# Patient Record
Sex: Male | Born: 1995 | Race: Black or African American | Hispanic: No | Marital: Single | Smoking: Never smoker
Health system: Southern US, Community
[De-identification: ages and names within clinical notes are randomized; demographics above are authoritative.]

## PROBLEM LIST (undated history)

## (undated) DIAGNOSIS — Z789 Other specified health status: Secondary | ICD-10-CM

## (undated) DIAGNOSIS — M67431 Ganglion, right wrist: Secondary | ICD-10-CM

## (undated) HISTORY — PX: NO PAST SURGERIES: SHX2092

---
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
## 2015-08-12 ENCOUNTER — Emergency Department (INDEPENDENT_AMBULATORY_CARE_PROVIDER_SITE_OTHER): Payer: Medicaid Other

## 2015-08-12 ENCOUNTER — Encounter (HOSPITAL_COMMUNITY): Payer: Self-pay | Admitting: Emergency Medicine

## 2015-08-12 ENCOUNTER — Emergency Department (INDEPENDENT_AMBULATORY_CARE_PROVIDER_SITE_OTHER)
Admission: EM | Admit: 2015-08-12 | Discharge: 2015-08-12 | Disposition: A | Discharge status: Home / Self Care | Payer: Medicaid Other | Source: Home / Self Care | Attending: Family Medicine | Admitting: Family Medicine

## 2015-08-12 DIAGNOSIS — M67431 Ganglion, right wrist: Secondary | ICD-10-CM

## 2015-08-12 NOTE — Discharge Instructions (Signed)
Wear splint for comfort and see orthopedist if further problems. Ice and advil for soreness.

## 2015-08-12 NOTE — ED Notes (Signed)
Right wrist with a cyst posterior wrist.  Patient reports wrist injury 2 years ago.  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  Mother and patient think this has aggravated old injury

## 2015-08-12 NOTE — ED Provider Notes (Signed)
CSN: [id number]     Arrival date & time 08/12/15  1749 History   None    Chief Complaint  Patient presents with  . Wrist Injury   (Consider location/radiation/quality/duration/timing/severity/associated sxs/prior Treatment) Patient is a 19 y.o. male presenting with wrist injury. The history is provided by the patient and a parent.  Wrist Injury Location:  Wrist Injury: yes   Mechanism of injury: fall   Mechanism of injury comment:  Reports hyperext injury to right wrist in 11th gr and recently noted sts cystic lump to dorsum of wrist.   History reviewed. No pertinent past medical history. History reviewed. No pertinent past surgical history. No family history on file. Social History  Substance Use Topics  . Smoking status: Never Smoker   . Smokeless tobacco: None  . Alcohol Use: No    Review of Systems  Musculoskeletal: Positive for joint swelling.  Skin: Negative.   All other systems reviewed and are negative.   Allergies  Review of patient's allergies indicates no known allergies.  Home Medications   Prior to Admission medications   Not on File   Meds Ordered and Administered this Visit  Medications - No data to display  BP 135/79 mmHg  Pulse 58  Temp(Src) 98.8 F (37.1 C) (Oral)  Resp 16  SpO2 100% No data found.   Physical Exam  Constitutional: He is oriented to person, place, and time. He appears well-developed and well-nourished.  Musculoskeletal: He exhibits tenderness.       Right wrist: He exhibits tenderness and swelling. He exhibits no deformity.       Arms: Neurological: He is alert and oriented to person, place, and time.  Skin: Skin is warm and dry.  Nursing note and vitals reviewed.   ED Course  Procedures (including critical care time)  Labs Review Labs Reviewed - No data to display  Imaging Review Dg Wrist Complete Right  08/12/2015  CLINICAL DATA:  Pain. Repetitive motion duties at work. No recent trauma. EXAM: RIGHT WRIST -  COMPLETE 3+ VIEW COMPARISON:  June 01, 2010 FINDINGS: Frontal, oblique, lateral, and ulnar deviation scaphoid images were obtained. No fracture or dislocation. Joint spaces appear intact. No erosive change or intra-articular calcification. IMPRESSION: No fracture or dislocation.  No appreciable arthropathy. Electronically Signed   By: Bretta BangWilliam  Woodruff III M.D.   On: 08/12/2015 19:19   X-rays reviewed and report per radiologist.   Visual Acuity Review  Right Eye Distance:   Left Eye Distance:   Bilateral Distance:    Right Eye Near:   Left Eye Near:    Bilateral Near:         MDM   1. Ganglion cyst of wrist, right        Linna HoffJames D Abrian Hanover, MD 08/12/15 [phone]

## 2019-12-05 ENCOUNTER — Ambulatory Visit: Payer: Medicaid Other | Attending: Internal Medicine

## 2019-12-05 DIAGNOSIS — Z23 Encounter for immunization: Secondary | ICD-10-CM

## 2019-12-05 NOTE — Progress Notes (Signed)
   Covid-19 Vaccination Clinic  Name:  URIYAH RASKA    MRN: [id number] DOB: [birth year]  12/05/2019  Mr. Danford was observed post Covid-19 immunization for 15 minutes without incident. He was provided with Vaccine Information Sheet and instruction to access the V-Safe system.   Mr. Fuqua was instructed to call 911 with any severe reactions post vaccine: Marland Kitchen Difficulty breathing  . Swelling of face and throat  . A fast heartbeat  . A bad rash all over body  . Dizziness and weakness   Immunizations Administered    Name Date Dose VIS Date Route   Pfizer COVID-19 Vaccine 12/05/2019  2:07 PM 0.3 mL 08/14/2019 Intramuscular   Manufacturer: [employer], [employer]   Lot: [id number]   NDC: [address]-4

## 2019-12-29 ENCOUNTER — Ambulatory Visit: Payer: Medicaid Other

## 2019-12-30 ENCOUNTER — Ambulatory Visit: Payer: Medicaid Other | Attending: Internal Medicine

## 2019-12-30 DIAGNOSIS — Z23 Encounter for immunization: Secondary | ICD-10-CM

## 2019-12-30 NOTE — Progress Notes (Signed)
   Covid-19 Vaccination Clinic  Name:  Jacob Bean    MRN: [id number] DOB: 1995/09/27  12/30/2019  Mr. Jacob Bean was observed post Covid-19 immunization for 15 minutes without incident. He was provided with Vaccine Information Sheet and instruction to access the V-Safe system.   Mr. Jacob Bean was instructed to call 911 with any severe reactions post vaccine: Marland Kitchen Difficulty breathing  . Swelling of face and throat  . A fast heartbeat  . A bad rash all over body  . Dizziness and weakness   Immunizations Administered    Name Date Dose VIS Date Route   Pfizer COVID-19 Vaccine 12/30/2019  8:28 AM 0.3 mL 10/28/2018 Intramuscular   Manufacturer: [employer], [employer]   Lot: [id number]   NDC: [address]-2

## 2021-05-15 ENCOUNTER — Ambulatory Visit (HOSPITAL_COMMUNITY)
Admission: EM | Admit: 2021-05-15 | Discharge: 2021-05-15 | Disposition: A | Discharge status: Home / Self Care | Payer: No Typology Code available for payment source | Attending: Sports Medicine | Admitting: Sports Medicine

## 2021-05-15 ENCOUNTER — Other Ambulatory Visit: Payer: Self-pay

## 2021-05-15 ENCOUNTER — Encounter (HOSPITAL_COMMUNITY): Payer: Self-pay

## 2021-05-15 DIAGNOSIS — M67431 Ganglion, right wrist: Secondary | ICD-10-CM | POA: Diagnosis not present

## 2021-05-15 MED ORDER — LIDOCAINE HCL (PF) 1 % IJ SOLN
INTRAMUSCULAR | Status: AC
  Filled 2021-05-15: qty 2

## 2021-05-15 NOTE — ED Triage Notes (Signed)
Pt presents w/ c/o a cyst on his right wrist as well as skin dryness on rt hand from gloves used while working

## 2021-05-15 NOTE — Discharge Instructions (Signed)
Can keep compression over the wrist for the next 2-3 days  May take Ibuprofen or Aleve or Tylenol if painful  If reoccurs, consider going to see orthopedics or f/u with PCP

## 2021-05-15 NOTE — ED Provider Notes (Signed)
[facility]    CSN: [id number] Arrival date & time: 05/15/21  1556      History   Chief Complaint Chief Complaint  Patient presents with   Cyst    Rt wrist with dry skin on top of hand    HPI Jacob Bean is a 25 y.o. male here for cyst on right dorsum of wrist.  HPI  Jacob Bean is a 25 year old male who presents for cyst on the right dorsum of his right wrist.  He also reports some dry skin over bilateral hands, he believes this is from wearing vinyl gloves at work.  In terms of the cyst, he states it has been present since he was about 77-47 years of age.  It waxes and wanes in size but believes it has always been present since this time, at times it is slightly tender when he bumps it or when he is flexing or extending the wrist, but most times it does not bother him.  He denies any redness, erythema, or any drainage from the wrist.  He denies any prior injury to the wrist.  Any weakness or limited range of motion because of the cyst.  In the past, he has been told it is a ganglion cyst by prior providers.  History reviewed. No pertinent past medical history.  Patient Active Problem List   Diagnosis Date Noted   CONSTIPATION 10/31/2006    History reviewed. No pertinent surgical history.     Home Medications    Prior to Admission medications   Not on File    Family History History reviewed. No pertinent family history.  Social History Social History   Tobacco Use   Smoking status: Never   Smokeless tobacco: Never  Substance Use Topics   Alcohol use: No   Drug use: No     Allergies   Patient has no known allergies.   Review of Systems Review of Systems - reports dry skin on hands - reports cyst on wrist - no fever/chills, rashes, or redness   Physical Exam Triage Vital Signs ED Triage Vitals  Enc Vitals Group     BP 05/15/21 1700 124/78     Pulse Rate 05/15/21 1700 71     Resp 05/15/21 1700 14     Temp 05/15/21 1700 98.6 F (37 C)      Temp Source 05/15/21 1700 Oral     SpO2 05/15/21 1700 96 %     Weight 05/15/21 1701 154 lb (69.9 kg)     Height 05/15/21 1701 5\' 6"  (1.676 m)     Head Circumference --      Peak Flow --      Pain Score 05/15/21 1701 0     Pain Loc --      Pain Edu? --      Excl. in GC? --    No data found.  Updated Vital Signs BP 124/78 (BP Location: Left Arm)   Pulse 71   Temp 98.6 F (37 C) (Oral)   Resp 14   Ht 5\' 6"  (1.676 m)   Wt 69.9 kg   SpO2 96%   BMI 24.86 kg/m   Visual Acuity Right Eye Distance:   Left Eye Distance:   Bilateral Distance:    Right Eye Near:   Left Eye Near:    Bilateral Near:     Physical Exam Gen: Well-appearing, in no acute distress; non-toxic CV: Regular Rate. Well-perfused. Warm.  Resp: Breathing unlabored on room air; no  wheezing. Psych: Fluid speech in conversation; appropriate affect; normal thought process Neuro: Sensation intact throughout. No gross coordination deficits.  MSK:  - Right wrist: There is approximately a 1 x 1.5 cm raised cyst on the dorsum of the right wrist.  There is no overlying erythema, ecchymosis swelling or induration.  There is mild TTP over this wrist joint.  There is full range of motion at the wrist, although slight tenderness with maximal wrist flexion.  Station to the wrist and all 5 digits of the right upper extremity intact to light touch on the dorsal and plantar aspect.  No TTP of the DRUJ, ulnar styloid or radius.  Strength 5/5 in all directions of the wrist and with grip strength.  U/S: Bedside ultrasound demonstrates approximately 1 x 1.5 cm hypoechoic, well-circumscribed cyst without septation or calcification within.  No vascular flow intake   UC Treatments / Results  Labs (all labs ordered are listed, but only abnormal results are displayed) Labs Reviewed - No data to display  EKG   Radiology No results found.  Procedures Incision and Drainage  Date/Time: 05/15/2021 5:59 PM Performed by: Madelyn Brunner, DO Authorized by: Madelyn Brunner, DO   Consent:    Consent obtained:  Verbal   Consent given by:  Patient   Risks, benefits, and alternatives were discussed: yes     Risks discussed:  Bleeding, incomplete drainage, pain and infection   Alternatives discussed:  Alternative treatment, observation and no treatment Universal protocol:    Procedure explained and questions answered to patient or proxy's satisfaction: yes     Relevant documents present and verified: yes     Test results available : yes     Imaging studies available: yes     Site/side marked: yes     Immediately prior to procedure, a time out was called: yes     Patient identity confirmed:  Verbally with patient Location:    Type:  Ganglion cyst   Size:  1 x 1.5cm   Location:  Upper extremity   Upper extremity location:  Wrist   Wrist location:  R wrist Pre-procedure details:    Skin preparation:  Povidone-iodine and chlorhexidine with alcohol Sedation:    Sedation type:  None Anesthesia:    Anesthesia method:  Local infiltration   Local anesthetic:  Lidocaine 1% w/o epi Procedure type:    Complexity:  Simple Post-procedure details:    Procedure completion:  Tolerated well, no immediate complications (including critical care time)  - Aspiration yielded approx 0.3 mL of viscous, yellow-clear liquid   Medications Ordered in UC Medications - No data to display  Initial Impression / Assessment and Plan / UC Course  I have reviewed the triage vital signs and the nursing notes.  Pertinent labs & imaging results that were available during my care of the patient were reviewed by me and considered in my medical decision making (see chart for details).     Dorsal ganglion cyst, left wrist - present x 7-8 years.  Discussed conservative treatment for the patient, however patient desired aspiration.  I&D aspiration, as noted above yielded 0.3 mL of a clear, viscous liquid.  Wrist was wrapped with compression Ace wrap.   He is to keep compression over this for the next 2-3 days, may take over-the-counter anti-inflammatories if painful.  Risk/benefits/indications discussed.  Patient safe for discharge home.  He may follow-up with his PCP if this returns, we did discuss the likelihood of this returning even with aspiration. Final  Clinical Impressions(s) / UC Diagnoses   Final diagnoses:  Ganglion cyst of dorsum of right wrist     Discharge Instructions      Can keep compression over the wrist for the next 2-3 days  May take Ibuprofen or Aleve or Tylenol if painful  If reoccurs, consider going to see orthopedics or f/u with PCP      ED Prescriptions   None    PDMP not reviewed this encounter.   Madelyn Brunner, [place] 05/15/21 1823

## 2021-07-04 ENCOUNTER — Other Ambulatory Visit: Payer: Self-pay

## 2021-07-04 ENCOUNTER — Encounter (HOSPITAL_COMMUNITY): Payer: Self-pay

## 2021-07-04 ENCOUNTER — Ambulatory Visit (HOSPITAL_COMMUNITY)
Admission: EM | Admit: 2021-07-04 | Discharge: 2021-07-04 | Disposition: A | Discharge status: Home / Self Care | Payer: No Typology Code available for payment source

## 2021-07-04 DIAGNOSIS — M67431 Ganglion, right wrist: Secondary | ICD-10-CM

## 2021-07-04 NOTE — ED Triage Notes (Signed)
Pt presents with c/o a cyst on the R wrist.

## 2021-07-04 NOTE — ED Provider Notes (Signed)
[facility]    CSN: [id number] Arrival date & time: 07/04/21  1724      History   Chief Complaint Chief Complaint  Patient presents with   Hand Problem    R    HPI ARUSH GATLIFF is a 25 y.o. male presenting for follow-up of R wrist ganglion cyst that has been present for 11 to 12 years.  Medical history noncontributory.  This patient was last at our urgent care 1 month ago when the cyst was drained. It immediately returned. He was advised to follow-up with PCP or Ortho, which he has not done as he does not have either.  This is minimally painful and he is left-handed.  HPI  History reviewed. No pertinent past medical history.  Patient Active Problem List   Diagnosis Date Noted   CONSTIPATION 10/31/2006    History reviewed. No pertinent surgical history.     Home Medications    Prior to Admission medications   Not on File    Family History History reviewed. No pertinent family history.  Social History Social History   Tobacco Use   Smoking status: Never   Smokeless tobacco: Never  Substance Use Topics   Alcohol use: No   Drug use: No     Allergies   Patient has no known allergies.   Review of Systems Review of Systems  Skin:        R wrist cyst    Physical Exam Triage Vital Signs ED Triage Vitals  Enc Vitals Group     BP 07/04/21 1838 118/88     Pulse Rate 07/04/21 1838 63     Resp 07/04/21 1838 16     Temp 07/04/21 1838 98.5 F (36.9 C)     Temp Source 07/04/21 1838 Oral     SpO2 07/04/21 1838 99 %     Weight --      Height --      Head Circumference --      Peak Flow --      Pain Score 07/04/21 1837 0     Pain Loc --      Pain Edu? --      Excl. in GC? --    No data found.  Updated Vital Signs BP 118/88 (BP Location: Right Arm)   Pulse 63   Temp 98.5 F (36.9 C) (Oral)   Resp 16   SpO2 99%   Visual Acuity Right Eye Distance:   Left Eye Distance:   Bilateral Distance:    Right Eye Near:   Left Eye Near:     Bilateral Near:     Physical Exam Vitals reviewed.  Constitutional:      General: He is not in acute distress.    Appearance: Normal appearance. He is not ill-appearing.  HENT:     Head: Normocephalic and atraumatic.  Pulmonary:     Effort: Pulmonary effort is normal.  Musculoskeletal:     Comments: R wrist- 1x1.5 cm raised cyst on dorsum wrist. No other overlying skin changes. Mildly TTP over joint. Full ROM, slight tenderness with maximal flexion.   Neurological:     General: No focal deficit present.     Mental Status: He is alert and oriented to person, place, and time.  Psychiatric:        Mood and Affect: Mood normal.        Behavior: Behavior normal.        Thought Content: Thought content normal.  Judgment: Judgment normal.    UC Treatments / Results  Labs (all labs ordered are listed, but only abnormal results are displayed) Labs Reviewed - No data to display  EKG   Radiology No results found.  Procedures Procedures (including critical care time)  Medications Ordered in UC Medications - No data to display  Initial Impression / Assessment and Plan / UC Course  I have reviewed the triage vital signs and the nursing notes.  Pertinent labs & imaging results that were available during my care of the patient were reviewed by me and considered in my medical decision making (see chart for details).     This patient is a very pleasant 25 y.o. year old male presenting with ganglion cyst R wrist. Neurovascularly intact.  Cyst has been present for 11 years, this was drained at our urgent care 1 month ago and immediately returned.  Advised him that drainage alone will likely not work and he must follow-up with Ortho.  Information provided..   Final Clinical Impressions(s) / UC Diagnoses   Final diagnoses:  Ganglion cyst of dorsum of right wrist     Discharge Instructions      -Follow-up with an orthopedist. I recommend [facility] at [address] [place], [place] [address]. You can schedule an appointment by calling [phone]) or online ([email or web address]), but they also have a walk-in clinic M-F 8a-8p and Sat 10a-3p.      ED Prescriptions   None    PDMP not reviewed this encounter.   Rhys Martini, PA-C 07/04/21 1904

## 2021-07-04 NOTE — Discharge Instructions (Addendum)
-  Follow-up with an orthopedist. I recommend [facility] at [address] [place], [place] [address]. You can schedule an appointment by calling [phone]) or online ([email or web address]), but they also have a walk-in clinic M-F 8a-8p and Sat 10a-3p.

## 2021-10-12 ENCOUNTER — Other Ambulatory Visit: Payer: Self-pay

## 2021-10-12 ENCOUNTER — Ambulatory Visit (INDEPENDENT_AMBULATORY_CARE_PROVIDER_SITE_OTHER): Payer: No Typology Code available for payment source | Admitting: Orthopedic Surgery

## 2021-10-12 ENCOUNTER — Encounter: Payer: Self-pay | Admitting: Orthopedic Surgery

## 2021-10-12 DIAGNOSIS — M67431 Ganglion, right wrist: Secondary | ICD-10-CM | POA: Diagnosis not present

## 2021-10-12 NOTE — Progress Notes (Signed)
Office Visit Note   Patient: Jacob Bean           Date of Birth: [birth year]           MRN: [id number] Visit Date: 10/12/2021              Requested by: No referring provider defined for this encounter. PCP: Pcp, No   Assessment & Plan: Visit Diagnoses:  1. Ganglion cyst of dorsum of right wrist     Plan: Discussed with patient that is hard to really tell if there is a dorsal carpal ganglion present.  There is the suggestion of a mass with full wrist flexion but no mass present with wrist in neutral.  An attempt was made to aspirate the apparent cyst with the wrist flexed but no fluid aspirated.  He would like this to be investigated further so will refer him for an ultrasound to look for a ganglion cyst.  I can see him back after the study is completed.   Follow-Up Instructions: No follow-ups on file.   Orders:  Orders Placed This Encounter  Procedures   [place] Extrem Up Right Ltd   No orders of the defined types were placed in this encounter.     Procedures: No procedures performed   Clinical Data: No additional findings.   Subjective: Chief Complaint  Patient presents with   Right Wrist - Pain    This is a 26 year old right-hand-dominant male who presents with history of a mass in the dorsal central aspect of the wrist.  Says this has been going on since high school some years ago.  He has had it aspirated multiple times but it has never gone away.  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  He has been seen in urgent care several times for this issue.  He denies any numbness or paresthesias.  Denies any weakness.  Denies any recent trauma.   Review of Systems   Objective: Vital Signs: There were no vitals taken for this visit.  Physical Exam Constitutional:      Appearance: Normal appearance.  Cardiovascular:     Rate and Rhythm: Normal rate.     Pulses: Normal pulses.  Pulmonary:     Effort: Pulmonary effort is  normal.  Skin:    General: Skin is warm and dry.     Capillary Refill: Capillary refill takes less than 2 seconds.  Neurological:     Mental Status: He is alert.    Right Hand Exam   Tenderness  The patient is experiencing no tenderness.   Range of Motion  The patient has normal right wrist ROM.   Other  Erythema: absent Sensation: normal Pulse: present  Comments:  No cyst visible or palpable with wrist in neutral.  Possible cystic structure at central aspect of wrist when flexed.  Does look more prominent than contralateral side.      Specialty Comments:  No specialty comments available.  Imaging: No results found.   PMFS History: Patient Active Problem List   Diagnosis Date Noted   CONSTIPATION 10/31/2006   History reviewed. No pertinent past medical history.  History reviewed. No pertinent family history.  History reviewed. No pertinent surgical history. Social History   Occupational History   Not on file  Tobacco Use   Smoking status: Never   Smokeless tobacco: Never  Substance and Sexual Activity   Alcohol use: No   Drug use: No  Sexual activity: Not on file

## 2021-10-13 ENCOUNTER — Other Ambulatory Visit: Payer: Self-pay | Admitting: Orthopedic Surgery

## 2021-10-13 DIAGNOSIS — M67431 Ganglion, right wrist: Secondary | ICD-10-CM

## 2021-10-13 NOTE — Addendum Note (Signed)
Addended by: Rip Harbour on: 10/13/2021 10:40 AM   Modules accepted: Orders

## 2021-10-18 ENCOUNTER — Telehealth: Payer: Self-pay | Admitting: Orthopedic Surgery

## 2021-10-18 NOTE — Telephone Encounter (Signed)
Called patient left message on voicemail to return call to schedule an appointment with Dr Frazier Butt after 10/20/2021 for [place] review

## 2021-10-20 ENCOUNTER — Ambulatory Visit (HOSPITAL_COMMUNITY)
Admission: RE | Admit: 2021-10-20 | Discharge: 2021-10-20 | Disposition: A | Discharge status: Home / Self Care | Payer: 59 | Source: Ambulatory Visit | Attending: Orthopedic Surgery | Admitting: Orthopedic Surgery

## 2021-10-20 ENCOUNTER — Other Ambulatory Visit (HOSPITAL_COMMUNITY): Payer: 59

## 2021-10-20 ENCOUNTER — Other Ambulatory Visit: Payer: Self-pay

## 2021-10-20 DIAGNOSIS — M67431 Ganglion, right wrist: Secondary | ICD-10-CM | POA: Diagnosis not present

## 2021-10-23 ENCOUNTER — Other Ambulatory Visit: Payer: Self-pay

## 2021-10-23 ENCOUNTER — Encounter: Payer: Self-pay | Admitting: Orthopedic Surgery

## 2021-10-23 ENCOUNTER — Ambulatory Visit (INDEPENDENT_AMBULATORY_CARE_PROVIDER_SITE_OTHER): Payer: 59 | Admitting: Orthopedic Surgery

## 2021-10-23 DIAGNOSIS — M67431 Ganglion, right wrist: Secondary | ICD-10-CM | POA: Diagnosis not present

## 2021-10-23 DIAGNOSIS — M67439 Ganglion, unspecified wrist: Secondary | ICD-10-CM | POA: Diagnosis not present

## 2021-10-23 NOTE — H&P (View-Only) (Signed)
° °  Office Visit Note   Patient: Jacob Bean           Date of Birth: 07-08-1996           MRN: [id number] Visit Date: 10/23/2021              Requested by: No referring provider defined for this encounter. PCP: Pcp, No   Assessment & Plan: Visit Diagnoses:  1. Dorsal wrist ganglion     Plan: We reviewed the results of his ultrasound which are suggestive of a small approximately 1 x 1 cm ganglion cyst of the dorsal wrist.  We again discussed treatment options including continued monitoring, aspiration, or surgical excision.  He wants to proceed with surgical excision.  Discussed risk of surgery including bleeding, infection, damage to blood vessels nerves, damage to extensor tendons, cyst recurrence.  We discussed the expected postop recovery course.  Surgical date will be confirmed with the patient in the near future.  Follow-Up Instructions: No follow-ups on file.   Orders:  No orders of the defined types were placed in this encounter.  No orders of the defined types were placed in this encounter.     Procedures: No procedures performed   Clinical Data: No additional findings.   Subjective: Chief Complaint  Patient presents with   Right Wrist - Follow-up    Ultrasound review    This is a 26 year old right-hand-dominant male who presents for follow up of a mass in the dorsal central aspect of the wrist.  Mass is been present for years.  It was previously not bothersome but now occasionally bothersome.  He has had multiple aspiration attempts.  He recently underwent ultrasound to further evaluate the cyst.   Review of Systems   Objective: Vital Signs: There were no vitals taken for this visit.  Physical Exam Constitutional:      Appearance: Normal appearance.  Cardiovascular:     Rate and Rhythm: Normal rate.     Pulses: Normal pulses.  Pulmonary:     Effort: Pulmonary effort is normal.  Skin:    General: Skin is warm and dry.     Capillary Refill:  Capillary refill takes less than 2 seconds.  Neurological:     Mental Status: He is alert.    Right Hand Exam   Tenderness  The patient is experiencing no tenderness.   Range of Motion  The patient has normal right wrist ROM.   Other  Erythema: absent Sensation: normal Pulse: present  Comments:  Small, 1cm, firm mass at dorsal central aspect of wrist.      Specialty Comments:  No specialty comments available.  Imaging: No results found.   PMFS History: Patient Active Problem List   Diagnosis Date Noted   Dorsal wrist ganglion 10/23/2021   CONSTIPATION 10/31/2006   No past medical history on file.  No family history on file.  No past surgical history on file. Social History   Occupational History   Not on file  Tobacco Use   Smoking status: Never   Smokeless tobacco: Never  Substance and Sexual Activity   Alcohol use: No   Drug use: No   Sexual activity: Not on file

## 2021-10-23 NOTE — Progress Notes (Signed)
° °  Office Visit Note °  °Patient: Jacob Bean           °Date of Birth: 08/14/1996           °MRN: [id number] °Visit Date: 10/23/2021 °             °Requested by: No referring provider defined for this encounter. °PCP: Pcp, No ° ° °Assessment & Plan: °Visit Diagnoses:  °1. Dorsal wrist ganglion   ° ° °Plan: We reviewed the results of his ultrasound which are suggestive of a small approximately 1 x 1 cm ganglion cyst of the dorsal wrist.  We again discussed treatment options including continued monitoring, aspiration, or surgical excision.  He wants to proceed with surgical excision.  Discussed risk of surgery including bleeding, infection, damage to blood vessels nerves, damage to extensor tendons, cyst recurrence.  We discussed the expected postop recovery course.  Surgical date will be confirmed with the patient in the near future. ° °Follow-Up Instructions: No follow-ups on file.  ° °Orders:  °No orders of the defined types were placed in this encounter. ° °No orders of the defined types were placed in this encounter. ° ° ° ° Procedures: °No procedures performed ° ° °Clinical Data: °No additional findings. ° ° °Subjective: °Chief Complaint  °Patient presents with  ° Right Wrist - Follow-up  °  Ultrasound review  ° ° °This is a 26-year-old right-hand-dominant male who presents for follow up of a mass in the dorsal central aspect of the wrist.  Mass is been present for years.  It was previously not bothersome but now occasionally bothersome.  He has had multiple aspiration attempts.  He recently underwent ultrasound to further evaluate the cyst. ° ° °Review of Systems ° ° °Objective: °Vital Signs: There were no vitals taken for this visit. ° °Physical Exam °Constitutional:   °   Appearance: Normal appearance.  °Cardiovascular:  °   Rate and Rhythm: Normal rate.  °   Pulses: Normal pulses.  °Pulmonary:  °   Effort: Pulmonary effort is normal.  °Skin: °   General: Skin is warm and dry.  °   Capillary Refill:  Capillary refill takes less than 2 seconds.  °Neurological:  °   Mental Status: He is alert.  ° ° °Right Hand Exam  ° °Tenderness  °The patient is experiencing no tenderness.  ° °Range of Motion  °The patient has normal right wrist ROM.  ° °Other  °Erythema: absent °Sensation: normal °Pulse: present ° °Comments:  Small, 1cm, firm mass at dorsal central aspect of wrist.  ° ° ° ° °Specialty Comments:  °No specialty comments available. ° °Imaging: °No results found. ° ° °PMFS History: °Patient Active Problem List  ° Diagnosis Date Noted  ° Dorsal wrist ganglion 10/23/2021  ° CONSTIPATION 10/31/2006  ° °No past medical history on file.  °No family history on file.  °No past surgical history on file. °Social History  ° °Occupational History  ° Not on file  °Tobacco Use  ° Smoking status: Never  ° Smokeless tobacco: Never  °Substance and Sexual Activity  ° Alcohol use: No  ° Drug use: No  ° Sexual activity: Not on file  ° ° ° ° ° ° °

## 2021-10-26 ENCOUNTER — Encounter (HOSPITAL_BASED_OUTPATIENT_CLINIC_OR_DEPARTMENT_OTHER): Payer: Self-pay | Admitting: Orthopedic Surgery

## 2021-10-26 ENCOUNTER — Other Ambulatory Visit: Payer: Self-pay

## 2021-11-06 ENCOUNTER — Encounter (HOSPITAL_BASED_OUTPATIENT_CLINIC_OR_DEPARTMENT_OTHER)
Admission: RE | Disposition: A | Discharge status: Home / Self Care | Payer: Self-pay | Source: Home / Self Care | Attending: Orthopedic Surgery

## 2021-11-06 ENCOUNTER — Other Ambulatory Visit (HOSPITAL_COMMUNITY): Payer: Self-pay

## 2021-11-06 ENCOUNTER — Other Ambulatory Visit: Payer: Self-pay

## 2021-11-06 ENCOUNTER — Encounter (HOSPITAL_BASED_OUTPATIENT_CLINIC_OR_DEPARTMENT_OTHER): Payer: Self-pay | Admitting: Orthopedic Surgery

## 2021-11-06 ENCOUNTER — Ambulatory Visit (HOSPITAL_BASED_OUTPATIENT_CLINIC_OR_DEPARTMENT_OTHER): Payer: 59 | Admitting: Anesthesiology

## 2021-11-06 ENCOUNTER — Ambulatory Visit (HOSPITAL_BASED_OUTPATIENT_CLINIC_OR_DEPARTMENT_OTHER)
Admission: RE | Admit: 2021-11-06 | Discharge: 2021-11-06 | Disposition: A | Discharge status: Home / Self Care | Payer: 59 | Attending: Orthopedic Surgery | Admitting: Orthopedic Surgery

## 2021-11-06 DIAGNOSIS — M67431 Ganglion, right wrist: Secondary | ICD-10-CM

## 2021-11-06 HISTORY — PX: GANGLION CYST EXCISION: SHX1691

## 2021-11-06 HISTORY — DX: Other specified health status: Z78.9

## 2021-11-06 HISTORY — DX: Ganglion, right wrist: M67.431

## 2021-11-06 SURGERY — EXCISION, GANGLION CYST, WRIST
Anesthesia: Monitor Anesthesia Care | Site: Wrist | Laterality: Right

## 2021-11-06 MED ORDER — PROPOFOL 500 MG/50ML IV EMUL
INTRAVENOUS | Status: AC
  Filled 2021-11-06: qty 50

## 2021-11-06 MED ORDER — CEFAZOLIN SODIUM-DEXTROSE 2-4 GM/100ML-% IV SOLN
INTRAVENOUS | Status: AC
  Filled 2021-11-06: qty 100

## 2021-11-06 MED ORDER — PROPOFOL 500 MG/50ML IV EMUL
INTRAVENOUS | Status: DC | PRN
  Administered 2021-11-06: 100 ug/kg/min via INTRAVENOUS

## 2021-11-06 MED ORDER — BUPIVACAINE-EPINEPHRINE (PF) 0.25% -1:200000 IJ SOLN
INTRAMUSCULAR | Status: DC | PRN
  Administered 2021-11-06: 9 mL

## 2021-11-06 MED ORDER — BUPIVACAINE-EPINEPHRINE (PF) 0.25% -1:200000 IJ SOLN
INTRAMUSCULAR | Status: AC
  Filled 2021-11-06: qty 30

## 2021-11-06 MED ORDER — MIDAZOLAM HCL 2 MG/2ML IJ SOLN
INTRAMUSCULAR | Status: AC
  Filled 2021-11-06: qty 2

## 2021-11-06 MED ORDER — HYDROCODONE-ACETAMINOPHEN 5-325 MG PO TABS
1.0000 | ORAL_TABLET | Freq: Four times a day (QID) | ORAL | 0 refills | Status: AC | PRN
  Filled 2021-11-06: qty 20, 3d supply, fill #0

## 2021-11-06 MED ORDER — FENTANYL CITRATE (PF) 100 MCG/2ML IJ SOLN
INTRAMUSCULAR | Status: DC | PRN
  Administered 2021-11-06: 50 ug via INTRAVENOUS

## 2021-11-06 MED ORDER — LIDOCAINE 2% (20 MG/ML) 5 ML SYRINGE
INTRAMUSCULAR | Status: DC | PRN
  Administered 2021-11-06: 60 mg via INTRAVENOUS

## 2021-11-06 MED ORDER — ACETAMINOPHEN 500 MG PO TABS
ORAL_TABLET | ORAL | Status: AC
  Filled 2021-11-06: qty 2

## 2021-11-06 MED ORDER — LACTATED RINGERS IV SOLN
INTRAVENOUS | Status: DC
Start: 2021-11-06 — End: 2021-11-06

## 2021-11-06 MED ORDER — DEXMEDETOMIDINE (PRECEDEX) IN NS 20 MCG/5ML (4 MCG/ML) IV SYRINGE
PREFILLED_SYRINGE | INTRAVENOUS | Status: AC
  Filled 2021-11-06: qty 15

## 2021-11-06 MED ORDER — ACETAMINOPHEN 500 MG PO TABS
1000.0000 mg | ORAL_TABLET | Freq: Once | ORAL | Status: AC
  Administered 2021-11-06: 1000 mg via ORAL

## 2021-11-06 MED ORDER — FENTANYL CITRATE (PF) 100 MCG/2ML IJ SOLN: 25.0000 ug | INTRAMUSCULAR | Status: DC | PRN

## 2021-11-06 MED ORDER — CEFAZOLIN SODIUM-DEXTROSE 2-4 GM/100ML-% IV SOLN
2.0000 g | INTRAVENOUS | Status: AC
  Administered 2021-11-06: 2 g via INTRAVENOUS

## 2021-11-06 MED ORDER — MIDAZOLAM HCL 5 MG/5ML IJ SOLN
INTRAMUSCULAR | Status: DC | PRN
Start: 2021-11-06 — End: 2021-11-06
  Administered 2021-11-06: 2 mg via INTRAVENOUS

## 2021-11-06 MED ORDER — LIDOCAINE HCL (PF) 1 % IJ SOLN
INTRAMUSCULAR | Status: AC
  Filled 2021-11-06: qty 60

## 2021-11-06 MED ORDER — ONDANSETRON HCL 4 MG/2ML IJ SOLN
INTRAMUSCULAR | Status: DC | PRN
Start: 2021-11-06 — End: 2021-11-06
  Administered 2021-11-06: 4 mg via INTRAVENOUS

## 2021-11-06 MED ORDER — FENTANYL CITRATE (PF) 100 MCG/2ML IJ SOLN
INTRAMUSCULAR | Status: AC
  Filled 2021-11-06: qty 2

## 2021-11-06 SURGICAL SUPPLY — 44 items
APL PRP STRL LF DISP 70% ISPRP (MISCELLANEOUS) ×1
BLADE SURG 15 STRL LF DISP TIS (BLADE) ×1 IMPLANT
BLADE SURG 15 STRL SS (BLADE) ×2
BNDG CMPR 9X4 STRL LF SNTH (GAUZE/BANDAGES/DRESSINGS) ×1
BNDG ELASTIC 3X5.8 VLCR STR LF (GAUZE/BANDAGES/DRESSINGS) ×2 IMPLANT
BNDG ESMARK 4X9 LF (GAUZE/BANDAGES/DRESSINGS) ×2 IMPLANT
BNDG GAUZE ELAST 4 BULKY (GAUZE/BANDAGES/DRESSINGS) ×2 IMPLANT
BNDG PLASTER X FAST 3X3 WHT LF (CAST SUPPLIES) IMPLANT
BNDG PLSTR 9X3 FST ST WHT (CAST SUPPLIES)
CHLORAPREP W/TINT 26 (MISCELLANEOUS) ×2 IMPLANT
CORD BIPOLAR FORCEPS 12FT (ELECTRODE) ×2 IMPLANT
COVER BACK TABLE 60X90IN (DRAPES) ×2 IMPLANT
COVER MAYO STAND STRL (DRAPES) ×2 IMPLANT
CUFF TOURN SGL QUICK 18X4 (TOURNIQUET CUFF) IMPLANT
CUFF TOURN SGL QUICK 24 (TOURNIQUET CUFF)
CUFF TRNQT CYL 24X4X16.5-23 (TOURNIQUET CUFF) IMPLANT
DRAPE EXTREMITY T 121X128X90 (DISPOSABLE) ×2 IMPLANT
DRAPE SURG 17X23 STRL (DRAPES) ×2 IMPLANT
GAUZE SPONGE 4X4 12PLY STRL (GAUZE/BANDAGES/DRESSINGS) ×2 IMPLANT
GAUZE XEROFORM 1X8 LF (GAUZE/BANDAGES/DRESSINGS) ×2 IMPLANT
GLOVE SURG ENC MOIS LTX SZ7 (GLOVE) ×2 IMPLANT
GLOVE SURG POLYISO LF SZ7 (GLOVE) ×1 IMPLANT
GLOVE SURG UNDER POLY LF SZ7 (GLOVE) ×3 IMPLANT
GOWN STRL REUS W/ TWL LRG LVL3 (GOWN DISPOSABLE) ×1 IMPLANT
GOWN STRL REUS W/TWL LRG LVL3 (GOWN DISPOSABLE) ×2
GOWN STRL REUS W/TWL XL LVL3 (GOWN DISPOSABLE) ×2 IMPLANT
NDL HYPO 25X1 1.5 SAFETY (NEEDLE) IMPLANT
NEEDLE HYPO 25X1 1.5 SAFETY (NEEDLE) ×2 IMPLANT
NS IRRIG 1000ML POUR BTL (IV SOLUTION) ×2 IMPLANT
PACK BASIN DAY SURGERY FS (CUSTOM PROCEDURE TRAY) ×2 IMPLANT
PAD CAST 3X4 CTTN HI CHSV (CAST SUPPLIES) IMPLANT
PADDING CAST COTTON 3X4 STRL (CAST SUPPLIES)
SLEEVE SCD COMPRESS KNEE MED (STOCKING) IMPLANT
SPLINT FIBERGLASS 4X30 (CAST SUPPLIES) ×1 IMPLANT
SUCTION FRAZIER HANDLE 10FR (MISCELLANEOUS)
SUCTION TUBE FRAZIER 10FR DISP (MISCELLANEOUS) IMPLANT
SUT ETHILON 4 0 PS 2 18 (SUTURE) ×2 IMPLANT
SUT MNCRL AB 3-0 PS2 18 (SUTURE) ×2 IMPLANT
SUT VICRYL 4-0 PS2 18IN ABS (SUTURE) IMPLANT
SYR BULB EAR ULCER 3OZ GRN STR (SYRINGE) ×2 IMPLANT
SYR CONTROL 10ML LL (SYRINGE) ×1 IMPLANT
TOWEL GREEN STERILE FF (TOWEL DISPOSABLE) ×4 IMPLANT
TUBE CONNECTING 20X1/4 (TUBING) IMPLANT
UNDERPAD 30X36 HEAVY ABSORB (UNDERPADS AND DIAPERS) ×2 IMPLANT

## 2021-11-06 NOTE — Op Note (Signed)
? ?  Date of Surgery: 11/06/2021 ? ?INDICATIONS: Jacob Bean is a 26 y.o.-year-old male with right dorsal ganglion cyst that has failed conservative management including aspiration.  Risks, benefits, and alternatives to surgery were again discussed with the patient wishing to proceed with surgery.  Informed consent was signed after our discussion.  ? ?PREOPERATIVE DIAGNOSIS:  ?Right dorsal ganglion cyst ? ?POSTOPERATIVE DIAGNOSIS: Same. ? ?PROCEDURE:  ?Excision of right dorsal ganglion cyst ? ? ?SURGEON: Jacob Bean, M.D. ? ?ASSIST:  ? ?ANESTHESIA:  Local, MAC ? ?IV FLUIDS AND URINE: See anesthesia. ? ?ESTIMATED BLOOD LOSS: 5 mL. ? ?IMPLANTS: * No implants in log *  ? ?DRAINS: None ? ?COMPLICATIONS: None ? ?DESCRIPTION OF PROCEDURE: The patient was met in the preoperative holding area where the surgical site was marked and the consent form was verified.  The patient was then taken to the operating room and transferred to the operating table.  All bony prominences were well padded.  A tourniquet was applied to the right forearm.  Monitored sedation  was induced.  A formal time-out was performed to confirm that this was the correct patient, surgery, side, and site. A local block was performed using 0.25% marcaine with epinephrine. The operative extremity was prepped and draped in the usual and sterile fashion.  ? ?Following the timeout, the limb was gently exsanguinated with an Esmarch bandage.  A transverse incision was made centered over the mass.  The skin subcutaneous tissue was divided.  Blunt dissection was used to identify the extensor retinaculum.  This was incised.  The mass was identified within the fourth dorsal compartment.  The tendons were bluntly retracted.  Circumferential blunt dissection was used to free up the cyst.  The cyst was followed deep into the wound and the stalk identified as it entered the dorsal radiocarpal capsule.  The cyst was then removed en bloc with care taken to remove the stalk  of the cyst.  The defect in the dorsal capsule was identified.  It was coagulated with bipolar cautery.  The mass was then passed off the back table as a specimen for pathology.  The wound was then thoroughly irrigated.  The tourniquet was let down hemostasis was achieved with bipolar cautery and direct pressure.  The skin was closed using 4-0 nylon sutures in horizontal mattress fashion. ? ?The wound was dressed with Xeroform, 4 x 4's, cast padding, and a well-padded volar splint. ? ?The patient was reversed from sedation and transferred the postoperative bed.  All counts were correct observed in the procedure.  The patient was then taken the PACU in stable condition. ? ?POSTOPERATIVE PLAN: He will be discharged to home with appropriate pain medication and discharge instructions.  I will see him back in 10 to 14 days for his first postop visit. ? ?Jacob Nine, MD ?2:05 PM  ?

## 2021-11-06 NOTE — Transfer of Care (Signed)
Immediate Anesthesia Transfer of Care Note ? ?Patient: Jacob Bean ? ?Procedure(s) Performed: REMOVAL RIGHT DORSAL GANGLION OF WRIST (Right: Wrist) ? ?Patient Location: PACU ? ?Anesthesia Type:MAC ? ?Level of Consciousness: drowsy ? ?Airway & Oxygen Therapy: Patient Spontanous Breathing and Patient connected to face mask oxygen ? ?Post-op Assessment: Report given to RN and Post -op Vital signs reviewed and stable ? ?Post vital signs: Reviewed and stable ? ?Last Vitals:  ?Vitals Value Taken Time  ?BP 99/59 11/06/21 1406  ?Temp    ?Pulse 66 11/06/21 1407  ?Resp 13 11/06/21 1407  ?SpO2 99 % 11/06/21 1407  ?Vitals shown include unvalidated device data. ? ?Last Pain:  ?Vitals:  ? 11/06/21 1245  ?TempSrc: Oral  ?PainSc: 0-No pain  ?   ? ?Patients Stated Pain Goal: 3 (11/06/21 1245) ? ?Complications: No notable events documented. ?

## 2021-11-06 NOTE — Anesthesia Preprocedure Evaluation (Addendum)
Anesthesia Evaluation  ?Patient identified by MRN, date of birth, ID band ?Patient awake ? ? ? ?Reviewed: ?Allergy & Precautions, NPO status , Patient's Chart, lab work & pertinent test results ? ?Airway ?Mallampati: I ? ?TM Distance: >3 FB ?Neck ROM: Full ? ? ? Dental ?no notable dental hx. ?(+) Teeth Intact, Dental Advisory Given ?  ?Pulmonary ?neg pulmonary ROS,  ?  ?Pulmonary exam normal ?breath sounds clear to auscultation ? ? ? ? ? ? Cardiovascular ?negative cardio ROS ?Normal cardiovascular exam ?Rhythm:Regular Rate:Normal ? ? ?  ?Neuro/Psych ?negative neurological ROS ? negative psych ROS  ? GI/Hepatic ?negative GI ROS, Neg liver ROS,   ?Endo/Other  ?negative endocrine ROS ? Renal/GU ?negative Renal ROS  ?negative genitourinary ?  ?Musculoskeletal ?negative musculoskeletal ROS ?(+)  ? Abdominal ?  ?Peds ? Hematology ?negative hematology ROS ?(+)   ?Anesthesia Other Findings ? ? Reproductive/Obstetrics ? ?  ? ? ? ? ? ? ? ? ? ? ? ? ? ?  ?  ? ? ? ? ? ? ? ?Anesthesia Physical ?Anesthesia Plan ? ?ASA: 1 ? ?Anesthesia Plan: MAC  ? ?Post-op Pain Management: Tylenol PO (pre-op)*  ? ?Induction: Intravenous ? ?PONV Risk Score and Plan: 1 and Propofol infusion, Treatment may vary due to age or medical condition, Midazolam, Ondansetron and Dexamethasone ? ?Airway Management Planned: Natural Airway ? ?Additional Equipment:  ? ?Intra-op Plan:  ? ?Post-operative Plan:  ? ?Informed Consent: I have reviewed the patients History and Physical, chart, labs and discussed the procedure including the risks, benefits and alternatives for the proposed anesthesia with the patient or authorized representative who has indicated his/her understanding and acceptance.  ? ? ? ?Dental advisory given ? ?Plan Discussed with: CRNA ? ?Anesthesia Plan Comments:   ? ? ? ? ? ? ?Anesthesia Quick Evaluation ? ?

## 2021-11-06 NOTE — Brief Op Note (Signed)
11/06/2021 ? ?2:04 PM ? ?PATIENT:  Jacob Bean  26 y.o. male ? ?PRE-OPERATIVE DIAGNOSIS:  Right dorsal ganglion cyst ? ?POST-OPERATIVE DIAGNOSIS:  Right dorsal ganglion cyst ? ?PROCEDURE:  Procedure(s): ?REMOVAL RIGHT DORSAL GANGLION OF WRIST (Right) ? ?SURGEON:  Surgeon(s) and Role: ?   * Sherilyn Cooter, MD - Primary ? ?PHYSICIAN ASSISTANT:  ? ?ASSISTANTS: none  ? ?ANESTHESIA:   local and MAC ? ?EBL:  2 mL  ? ?BLOOD ADMINISTERED:none ? ?DRAINS: none  ? ?LOCAL MEDICATIONS USED:  MARCAINE    ? ?SPECIMEN:  Source of Specimen:  Right dorsal wrist mass ? ?DISPOSITION OF SPECIMEN:  PATHOLOGY ? ?COUNTS:  YES ? ?TOURNIQUET:  * Missing tourniquet times found for documented tourniquets in log: [id number] * ?Total Tourniquet Time Documented: ?Forearm (Right) - 20 minutes ?Total: Forearm (Right) - 20 minutes ? ? ?DICTATION: .Dragon Dictation ? ?PLAN OF CARE: Discharge to home after PACU ? ?PATIENT DISPOSITION:  PACU - hemodynamically stable. ?  ?Delay start of Pharmacological VTE agent (>24hrs) due to surgical blood loss or risk of bleeding: not applicable ? ?

## 2021-11-06 NOTE — Anesthesia Postprocedure Evaluation (Signed)
Anesthesia Post Note ? ?Patient: Jacob Bean ? ?Procedure(s) Performed: REMOVAL RIGHT DORSAL GANGLION OF WRIST (Right: Wrist) ? ?  ? ?Patient location during evaluation: PACU ?Anesthesia Type: MAC ?Level of consciousness: awake and alert ?Pain management: pain level controlled ?Vital Signs Assessment: post-procedure vital signs reviewed and stable ?Respiratory status: spontaneous breathing, nonlabored ventilation, respiratory function stable and patient connected to nasal cannula oxygen ?Cardiovascular status: stable and blood pressure returned to baseline ?Postop Assessment: no apparent nausea or vomiting ?Anesthetic complications: no ? ? ?No notable events documented. ? ?Last Vitals:  ?Vitals:  ? 11/06/21 1430 11/06/21 1445  ?BP: 117/75 92/76  ?Pulse: (!) 53 (!) 53  ?Resp: 10 20  ?Temp:  36.5 ?C  ?SpO2: 100% 100%  ?  ?Last Pain:  ?Vitals:  ? 11/06/21 1445  ?TempSrc:   ?PainSc: 0-No pain  ? ? ?  ?  ?  ?  ?  ?  ? ?Andromeda Poppen L Christianne Zacher ? ? ? ? ?

## 2021-11-06 NOTE — Anesthesia Procedure Notes (Signed)
Procedure Name: [place] ?Date/Time: 11/06/2021 1:29 PM ?Performed by: Jafari Mckillop, Ernesta Amble, CRNA ?Pre-anesthesia Checklist: Patient identified, Emergency Drugs available, Suction available, Timeout performed and Patient being monitored ?Patient Re-evaluated:Patient Re-evaluated prior to induction ?Oxygen Delivery Method: Simple face mask ? ? ? ? ?

## 2021-11-06 NOTE — Discharge Instructions (Addendum)
? ? ?Audria Nine, M.D. ?Hand Surgery ? ?POST-OPERATIVE DISCHARGE INSTRUCTIONS ? ? ?PRESCRIPTIONS: ?You have been given a prescription to be taken as directed for post-operative pain control.  You may also take over the counter ibuprofen/aleve and tylenol for pain. Take this as directed on the packaging. Do not exceed 3000 mg tylenol/acetaminophen in 24 hours. ? ?Ibuprofen 600-800 mg (3-4) tablets by mouth every 6 hours as needed for pain.  ?OR ?Aleve 2 tablets by mouth every 12 hours (twice daily) as needed for pain.  ?AND/OR ?Tylenol 1000 mg (2 tablets) every 8 hours as needed for pain. ? ?Please use your pain medication carefully, as refills are limited and you may not be provided with one.  As stated above, please use over the counter pain medicine - it will also be helpful with decreasing your swelling.  ? ? ?ANESTHESIA: ?After your surgery, post-surgical discomfort or pain is likely. This discomfort can last several days to a few weeks. At certain times of the day your discomfort may be more intense.  ? ?Did you receive a nerve block?  ?A nerve block can provide pain relief for one hour to two days after your surgery. As long as the nerve block is working, you will experience little or no sensation in the area the surgeon operated on.  ?As the nerve block wears off, you will begin to experience pain or discomfort. It is very important that you begin taking your prescribed pain medication before the nerve block fully wears off. Treating your pain at the first sign of the block wearing off will ensure your pain is better controlled and more tolerable when full-sensation returns. Do not wait until the pain is intolerable, as the medicine will be less effective. It is better to treat pain in advance than to try and catch up.  ? ?General Anesthesia:  ?If you did not receive a nerve block during your surgery, you will need to start taking your pain medication shortly after your surgery and should continue to do  so as prescribed by your surgeon.   ? ? ?ICE AND ELEVATION: ?You may use ice for the first 48-72 hours, but it is not critical.   ?Motion of your fingers is very important s to decrease the swelling. Please follow the finger range of motion exercises below to assist you in regaining your motion. This should be done at least 10 repetitions 3-4 times a day.  ?Elevation, as much as possible for the next 48 hours, is critical for decreasing swelling as well as for pain relief. Elevation means when you are seated or lying down, you hand should be at or above your heart. When walking, the hand needs to be at or above the level of your elbow.  ?If the bandage gets too tight, it may need to be loosened. Please contact our office and we will instruct you in how to do this.  ? ? ?SURGICAL BANDAGES:  ?Keep your dressing and/or splint clean and dry at all times.  Do not remove until you are seen again in the office.  If careful, you may place a plastic bag over your bandage and tape the end to shower, but be careful, do not get your bandages wet.  ?  ? ?HAND THERAPY:  ?You may not need any. If you do, we will begin this at your follow up visit in the clinic.  ? ? ?ACTIVITY AND WORK: ?You are encouraged to move any fingers which are not in the  bandage. Attached is an instruction sheet on finger motion. Do this as much as you need to make your fingers move fully and keep the swelling down.  ?Light use of the fingers is allowed to assist the other hand with daily hygiene and eating, but strong gripping or lifting is often uncomfortable and should be avoided.  ?You might miss a variable period of time from work and hopefully this issue has been discussed prior to surgery. You may not do any heavy work with your affected hand for about 2 weeks.  ? ? ?[place] ?[address] ?[place],  [place]  [address] ?[phone]  ? ? ? ?Post Anesthesia Home Care Instructions ? ?Activity: ?Get plenty of rest for the  remainder of the day. A responsible individual must stay with you for 24 hours following the procedure.  ?For the next 24 hours, DO NOT: ?-Drive a car ?-Paediatric nurse ?-Drink alcoholic beverages ?-Take any medication unless instructed by your physician ?-Make any legal decisions or sign important papers. ? ?Meals: ?Start with liquid foods such as gelatin or soup. Progress to regular foods as tolerated. Avoid greasy, spicy, heavy foods. If nausea and/or vomiting occur, drink only clear liquids until the nausea and/or vomiting subsides. Call your physician if vomiting continues. ? ?Special Instructions/Symptoms: ?Your throat may feel dry or sore from the anesthesia or the breathing tube placed in your throat during surgery. If this causes discomfort, gargle with warm salt water. The discomfort should disappear within 24 hours. ? ?If you had a scopolamine patch placed behind your ear for the management of post- operative nausea and/or vomiting: ? ?1. The medication in the patch is effective for 72 hours, after which it should be removed.  Wrap patch in a tissue and discard in the trash. Wash hands thoroughly with soap and water. ?2. You may remove the patch earlier than 72 hours if you experience unpleasant side effects which may include dry mouth, dizziness or visual disturbances. ?3. Avoid touching the patch. Wash your hands with soap and water after contact with the patch. ? ? ?No tylenol until after 6:45 pm tonight if needed. ? ? ?    ?

## 2021-11-06 NOTE — Interval H&P Note (Signed)
History and Physical Interval Note: ? ?11/06/2021 ?1:04 PM ? ?CIPRIANO MILLIKAN  has presented today for surgery, with the diagnosis of Right dorsal ganglion cyst.  The various methods of treatment have been discussed with the patient and family. After consideration of risks, benefits and other options for treatment, the patient has consented to  Procedure(s): ?REMOVAL RIGHT DORSAL GANGLION OF WRIST (Right) as a surgical intervention.  The patient's history has been reviewed, patient examined, no change in status, stable for surgery.  I have reviewed the patient's chart and labs.  Questions were answered to the patient's satisfaction.   ? ? ?Lysandra Loughmiller Arantxa Piercey ? ? ?

## 2021-11-07 ENCOUNTER — Encounter (HOSPITAL_BASED_OUTPATIENT_CLINIC_OR_DEPARTMENT_OTHER): Payer: Self-pay | Admitting: Orthopedic Surgery

## 2021-11-07 LAB — SURGICAL PATHOLOGY

## 2021-11-16 ENCOUNTER — Other Ambulatory Visit: Payer: Self-pay

## 2021-11-16 ENCOUNTER — Ambulatory Visit (INDEPENDENT_AMBULATORY_CARE_PROVIDER_SITE_OTHER): Payer: 59 | Admitting: Orthopedic Surgery

## 2021-11-16 DIAGNOSIS — M67439 Ganglion, unspecified wrist: Secondary | ICD-10-CM

## 2021-11-16 NOTE — Progress Notes (Signed)
? ?  Post-Op Visit Note ?  ?Patient: Jacob Bean           ?Date of Birth: 01/03/1996           ?MRN: [id number] ?Visit Date: 11/16/2021 ?PCP: Pcp, No ? ? ?Assessment & Plan: ? ?Chief Complaint:  ?Chief Complaint  ?Patient presents with  ? Right Wrist - Routine Post Op  ?  Ganglion Cyst removal 11/06/21, splint removed and incision looks good.  ? ?Visit Diagnoses:  ?1. Dorsal wrist ganglion   ? ? ?Plan: Patient doing well now 10 days s/p right dorsal ganglion excision.  He has no pain.  His incision is clean and dry with no surrounding erythema or induration.  Nylon sutures removed.  Pathology consistent with a dorsal ganglion cyst. He can follow up as needed.  ? ?Follow-Up Instructions: No follow-ups on file.  ? ?Orders:  ?No orders of the defined types were placed in this encounter. ? ?No orders of the defined types were placed in this encounter. ? ? ?Imaging: ?No results found. ? ?PMFS History: ?Patient Active Problem List  ? Diagnosis Date Noted  ? Dorsal wrist ganglion 10/23/2021  ? CONSTIPATION 10/31/2006  ? ?Past Medical History:  ?Diagnosis Date  ? Ganglion cyst of dorsum of right wrist   ? Medical history non-contributory   ?  ?No family history on file.  ?Past Surgical History:  ?Procedure Laterality Date  ? GANGLION CYST EXCISION Right 11/06/2021  ? Procedure: REMOVAL RIGHT DORSAL GANGLION OF WRIST;  Surgeon: Marlyne Beards, MD;  Location: [facility];  Service: Orthopedics;  Laterality: Right;  ? NO PAST SURGERIES    ? ?Social History  ? ?Occupational History  ? Not on file  ?Tobacco Use  ? Smoking status: Never  ? Smokeless tobacco: Never  ?Vaping Use  ? Vaping Use: Never used  ?Substance and Sexual Activity  ? Alcohol use: No  ? Drug use: No  ? Sexual activity: Not on file  ? ? ? ?

## 2023-06-02 IMAGING — US US EXTREM UP *R* LTD
1 series · 14 of 21 positions shown · non-contrast
Comparison: None.

CLINICAL DATA: Ganglion cyst since 9735 along the dorsal aspect of
the wrist

EXAM:
ULTRASOUND RIGHT UPPER EXTREMITY LIMITED
TECHNIQUE: Ultrasound examination of the upper extremity soft tissues was
performed in the area of clinical concern.

[Series 1: us soft tissue right upper extremity limited (non- · 21 acquisitions, 14 frames shown]
[im 1/21]
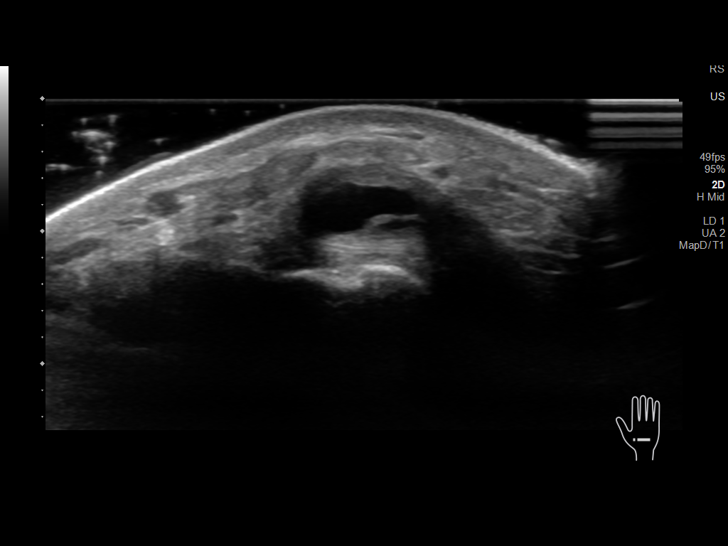
[im 3/21]
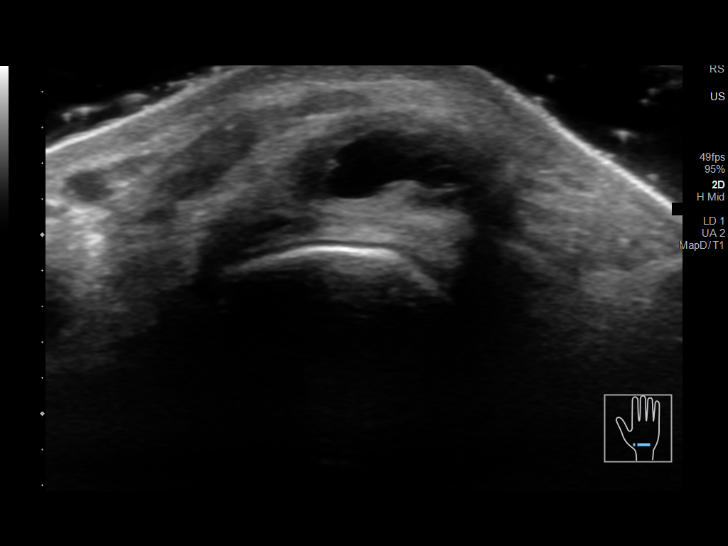
[im 4/21]
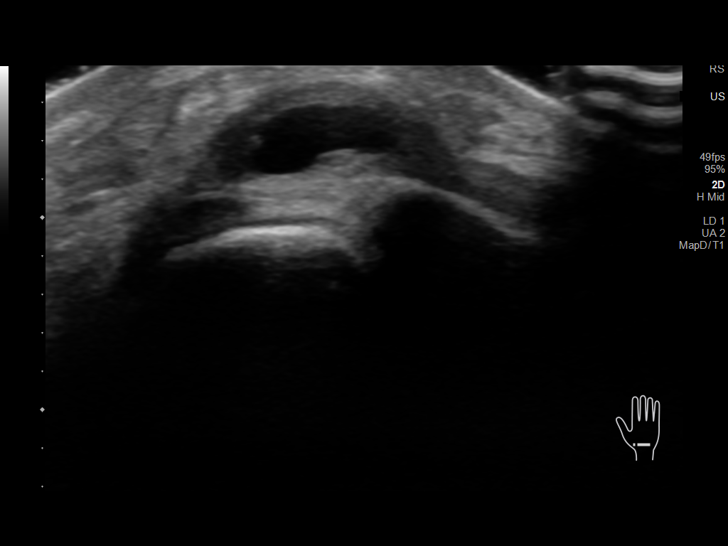
[im 6/21]
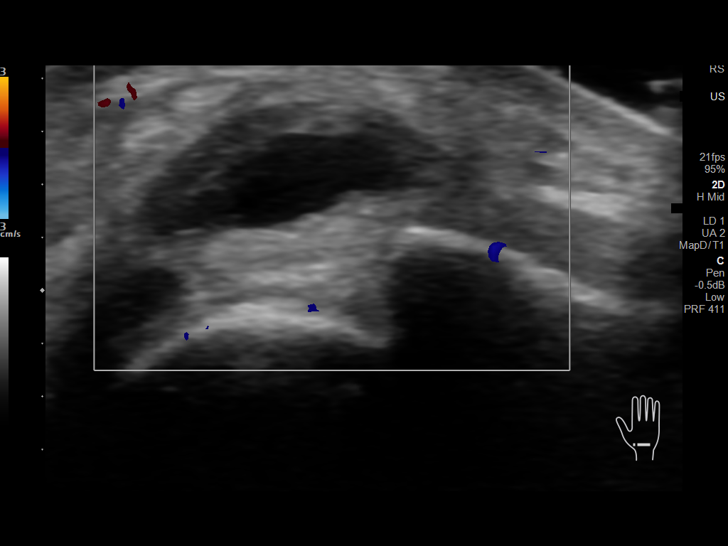
[im 7/21]
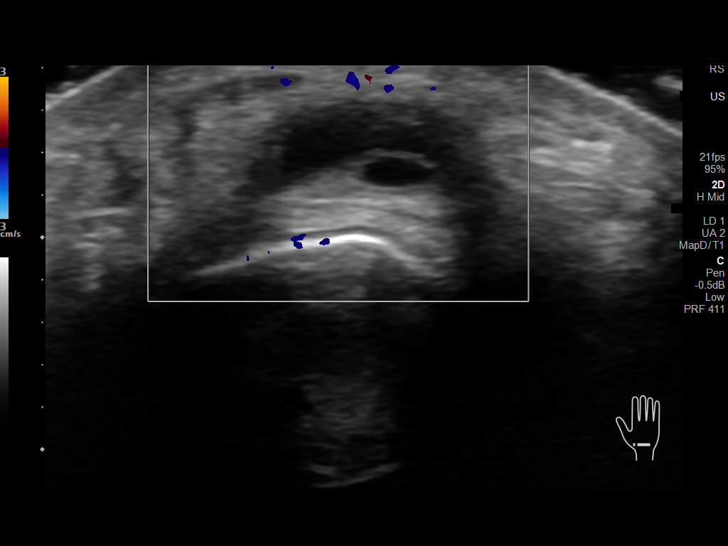
[im 9/21]
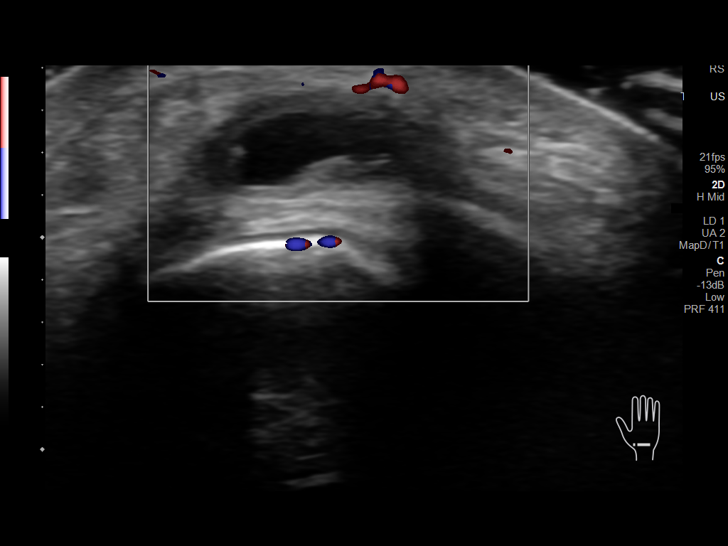
[im 10/21]
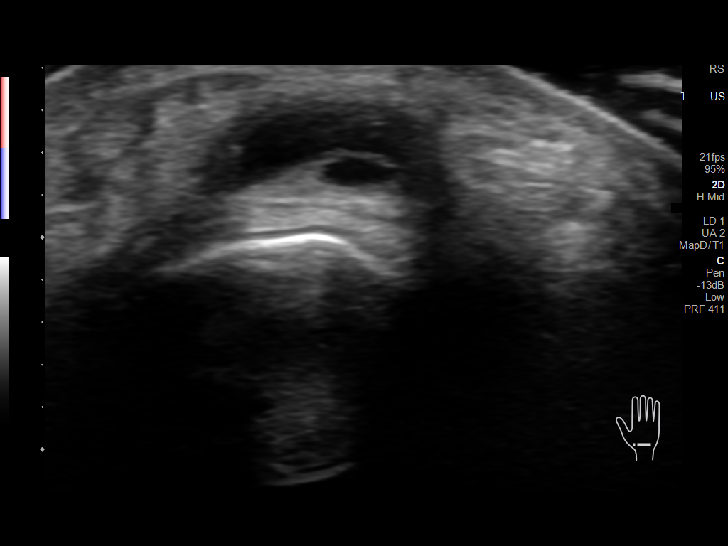
[im 12/21]
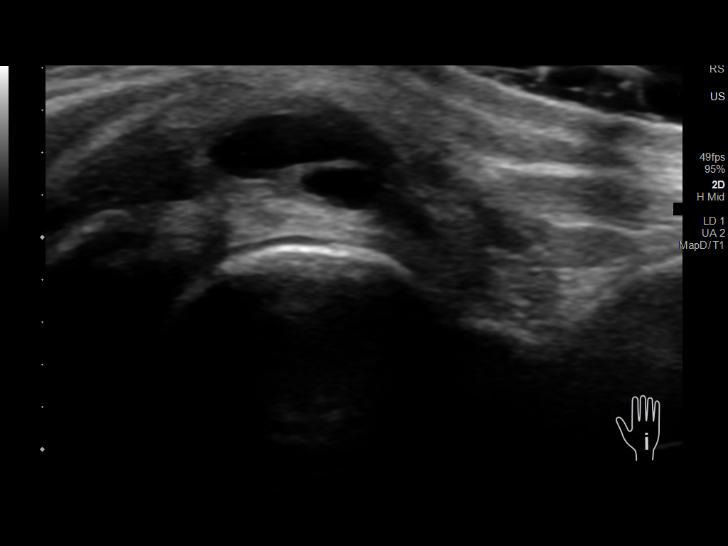
[im 13/21]
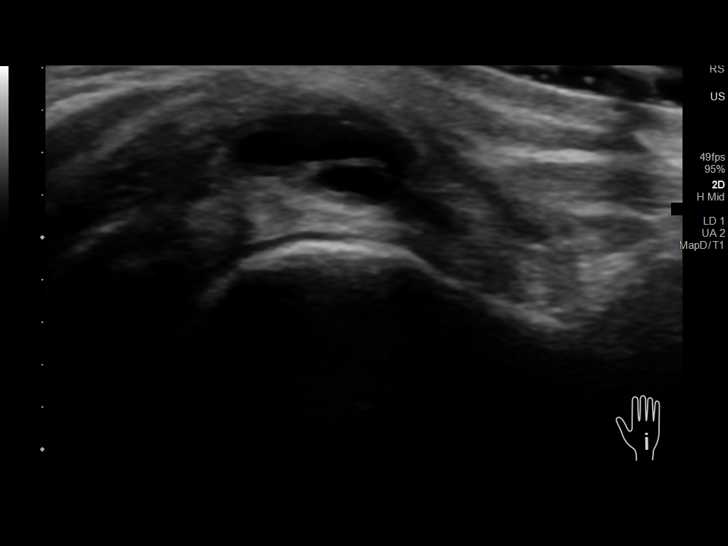
[im 15/21]
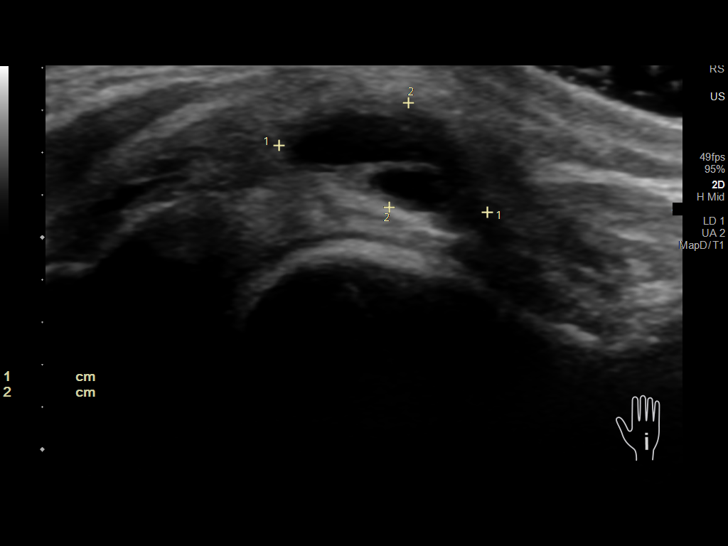
[im 16/21]
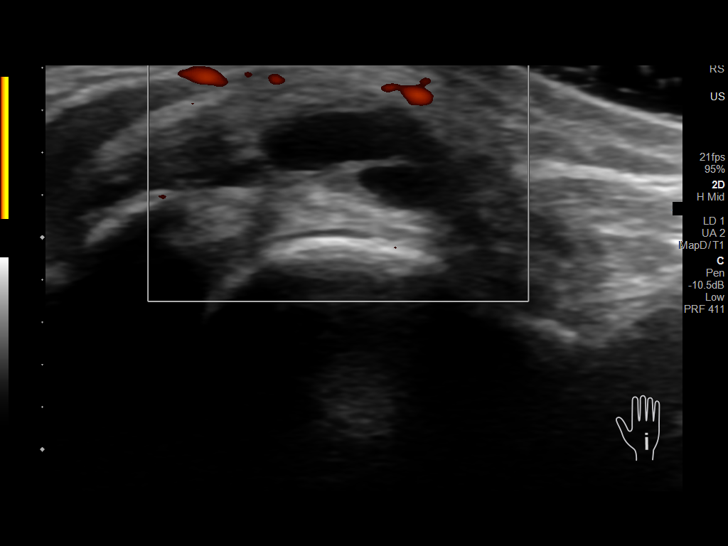
[im 18/21]
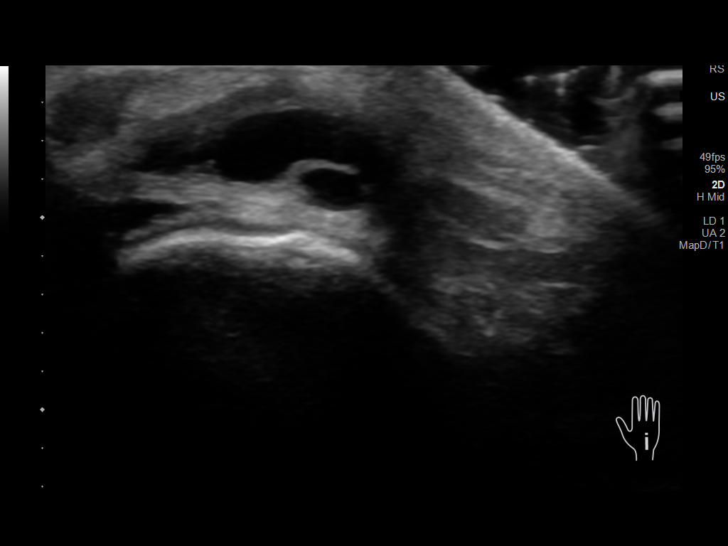
[im 19/21]
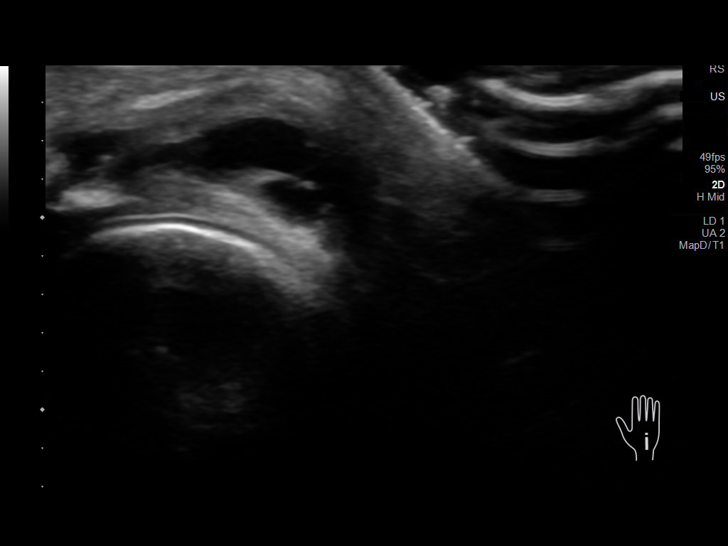
[im 21/21]
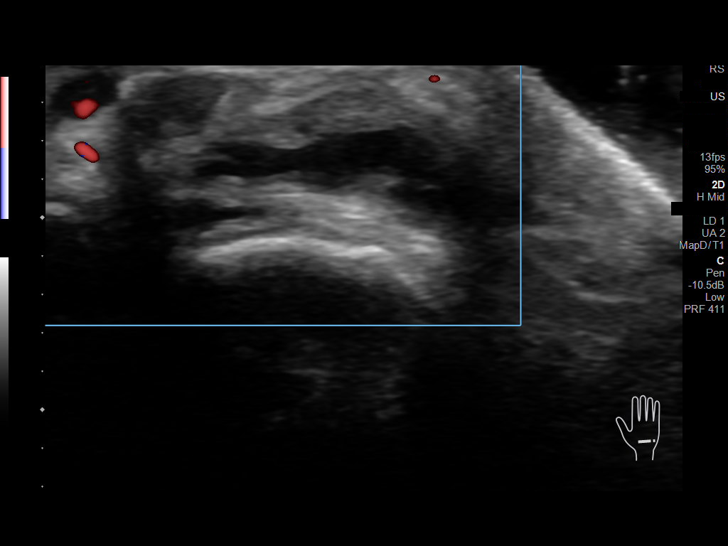

[14 of 21 positions shown; findings below may reference images not displayed]

FINDINGS: 1 x 0.5 x 0.8 cm anechoic mass along the dorsal aspect of the wrist
with posterior acoustic enhancement and without internal Doppler
flow consistent with a ganglion cyst.

No other solid or cystic mass.  No fluid collection or hematoma.
IMPRESSION: 1. A 1 x 0.5 x 0.8 cm ganglion cyst along the dorsal aspect of the
wrist.
